# Patient Record
Sex: Male | Born: 1980 | Race: Black or African American | Hispanic: No | Marital: Single | Smoking: Never smoker
Health system: Southern US, Community
[De-identification: ages and names within clinical notes are randomized; demographics above are authoritative.]

## PROBLEM LIST (undated history)

## (undated) DIAGNOSIS — E079 Disorder of thyroid, unspecified: Secondary | ICD-10-CM

## (undated) HISTORY — DX: Disorder of thyroid, unspecified: E07.9

---

## 2011-07-24 ENCOUNTER — Ambulatory Visit: Payer: Self-pay | Admitting: Family Medicine

## 2011-08-04 ENCOUNTER — Emergency Department: Payer: Self-pay | Admitting: Internal Medicine

## 2011-08-04 LAB — COMPREHENSIVE METABOLIC PANEL
Anion Gap: 10 (ref 7–16)
Calcium, Total: 8.6 mg/dL (ref 8.5–10.1)
Chloride: 104 mmol/L (ref 98–107)
Co2: 28 mmol/L (ref 21–32)
Creatinine: 1.28 mg/dL (ref 0.60–1.30)
EGFR (Non-African Amer.): >60
Osmolality: 283 (ref 275–301)
Potassium: 3.5 mmol/L (ref 3.5–5.1)
SGOT(AST): 26 U/L (ref 15–37)
SGPT (ALT): 35 U/L
Sodium: 142 mmol/L (ref 136–145)

## 2011-08-04 LAB — CBC
HCT: 42.3 % (ref 40.0–52.0)
MCV: 85 fL (ref 80–100)
Platelet: 222 10*3/uL (ref 150–440)
WBC: 6.6 10*3/uL (ref 3.8–10.6)

## 2011-08-04 LAB — ETHANOL
Ethanol %: 0.131 % — ABNORMAL HIGH (ref 0.000–0.080)
Ethanol: 131 mg/dL
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
## 2015-10-13 ENCOUNTER — Encounter: Payer: Self-pay | Admitting: Family

## 2015-10-13 ENCOUNTER — Ambulatory Visit (INDEPENDENT_AMBULATORY_CARE_PROVIDER_SITE_OTHER): Payer: BLUE CROSS/BLUE SHIELD | Admitting: Family

## 2015-10-13 DIAGNOSIS — E039 Hypothyroidism, unspecified: Secondary | ICD-10-CM

## 2015-10-13 DIAGNOSIS — A6000 Herpesviral infection of urogenital system, unspecified: Secondary | ICD-10-CM | POA: Diagnosis not present

## 2015-10-13 DIAGNOSIS — Z0000 Encounter for general adult medical examination without abnormal findings: Secondary | ICD-10-CM | POA: Diagnosis not present

## 2015-10-13 NOTE — Progress Notes (Signed)
Subjective:    Patient ID: Logan Hicks, male    DOB: [birth year], 35 y.o.   MRN: [id number]  CC: Logan Hicks is a 35 y.o. male who presents today to establish care.  HPI: Patient here today to establish care as a new patient.  He states he recently saw NP at work, [employer], 3 weeks ago whom did his labs. We've not yet received these records.  He is feeling well and no complaints.Primarily here to establish care as sisters worried about his weight gain. Weighed 180 lbs 5-6 years ago, now weighing 223 lbs.    Hypothyroidism recently changed synthroid from 115 mcg to 125 mcg. No palpitations, excessive sweating, heat/cold intolerance. Diagnosed ~9 years. Has had an thyroid US in past, last 4 years ago.   Genital herpes- had for years, stable.    UTD on vaccines from work per patient.     HISTORY:  Past Medical History:  Diagnosis Date  . Thyroid disease    History reviewed. No pertinent surgical history. Family History  Problem Relation Age of Onset  . Hypertension Mother   . Healthy Father   . Healthy Sister   . Healthy Sister     Allergies: Review of patient's allergies indicates no known allergies. No current outpatient prescriptions on file prior to visit.   No current facility-administered medications on file prior to visit.     Social History  Substance Use Topics  . Smoking status: Never Smoker  . Smokeless tobacco: Never Used  . Alcohol use Yes     Comment: 2 beers, once a week.    Review of Systems  Constitutional: Negative for chills and fever.  HENT: Negative for congestion.   Respiratory: Negative for cough.   Cardiovascular: Negative for chest pain, palpitations and leg swelling.  Gastrointestinal: Negative for diarrhea, nausea and vomiting.  Genitourinary: Negative for frequency, penile pain, penile swelling and testicular pain.  Musculoskeletal: Negative for myalgias.  Skin: Negative for rash.  Neurological: Negative for headaches.    Hematological: Negative for adenopathy.  Psychiatric/Behavioral: Negative for confusion.      Objective:    BP 132/82 (BP Location: Left Arm, Patient Position: Sitting, Cuff Size: Large)   Pulse 80   Temp 98.3 F (36.8 C) (Oral)   Resp 16   Ht 5\' 9"  (1.753 m)   Wt 223 lb (101.2 kg)   SpO2 96%   BMI 32.93 kg/m  BP Readings from Last 3 Encounters:  10/13/15 132/82   Wt Readings from Last 3 Encounters:  10/13/15 223 lb (101.2 kg)    Physical Exam  Constitutional: He appears well-developed and well-nourished.  Neck: No thyroid mass and no thyromegaly present.  Cardiovascular: Regular rhythm and normal heart sounds.   Pulmonary/Chest: Effort normal and breath sounds normal. No respiratory distress. He has no wheezes. He has no rhonchi. He has no rales.  Genitourinary: Testes normal and penis normal. Cremasteric reflex is present. Right testis shows no mass, no swelling and no tenderness. Left testis shows no mass, no swelling and no tenderness. Circumcised.  Genitourinary Comments: Testicular exam performed. No mass or swelling appreciated.   Lymphadenopathy:       Head (right side): No submental, no submandibular, no tonsillar, no preauricular, no posterior auricular and no occipital adenopathy present.       Head (left side): No submental, no submandibular, no tonsillar, no preauricular, no posterior auricular and no occipital adenopathy present.    He has no cervical adenopathy.  He has no axillary adenopathy.  Neurological: He is alert.  Skin: Skin is warm and dry.  Psychiatric: He has a normal mood and affect. His speech is normal and behavior is normal.  Vitals reviewed.      Assessment & Plan:   Problem List Items Addressed This Visit      Endocrine   Hypothyroidism    No masses, asymmetry appreciated and thyroid exam.He also states he had a recent thyroid ultrasound in the past 4 years, and no history of nodules, or biopsy of thyroid. Patient has had recent  blood work including TSH done and his Synthroid dose adjusted. I'm awaiting these results. Patient is asymptomatic. He is frustrated by weight gain. I encouraged  lifestyle modifications including limiting his soda and working out is more than likely to affect his weight.      Relevant Medications   levothyroxine (SYNTHROID, LEVOTHROID) 125 MCG tablet     Genitourinary   Genital herpes    Stable. No acute lesions.      Relevant Medications   acyclovir (ZOVIRAX) 400 MG tablet     Other   Routine physical examination    Patient had recent screening labs done through occupational health at his employment. We are pending his records. He also states he is up-to-date on his immunizations per his employment. Testicular exam done today.       Other Visit Diagnoses   None.      I am having Logan Hicks maintain his acyclovir and levothyroxine.   Meds ordered this encounter  Medications  . acyclovir (ZOVIRAX) 400 MG tablet    Sig: Take 400 mg by mouth 2 (two) times daily.  Marland Kitchen. levothyroxine (SYNTHROID, LEVOTHROID) 125 MCG tablet    Sig: Take 125 mcg by mouth daily before breakfast.    Return precautions given.   Risks, benefits, and alternatives of the medications and treatment plan prescribed today were discussed, and patient expressed understanding.   Education regarding symptom management and diagnosis given to patient on AVS.  Continue to follow with Rennie PlowmanMargaret Aldora Perman, FNP for routine health maintenance.   Molly Maduroravis Kniskern and I agreed with plan.   Rennie PlowmanMargaret Annelle Behrendt, FNP

## 2015-10-13 NOTE — Assessment & Plan Note (Signed)
Patient had recent screening labs done through occupational health at his employment. We are pending his records. He also states he is up-to-date on his immunizations per his employment. Testicular exam done today.

## 2015-10-13 NOTE — Assessment & Plan Note (Signed)
No masses, asymmetry appreciated and thyroid exam.He also states he had a recent thyroid ultrasound in the past 4 years, and no history of nodules, or biopsy of thyroid. Patient has had recent blood work including TSH done and his Synthroid dose adjusted. I'm awaiting these results. Patient is asymptomatic. He is frustrated by weight gain. I encouraged  lifestyle modifications including limiting his soda and working out is more than likely to affect his weight.

## 2015-10-13 NOTE — Patient Instructions (Signed)
My pleasure meeting you.  Health Maintenance, Male A healthy lifestyle and preventative care can promote health and wellness.  Maintain regular health, dental, and eye exams.  Eat a healthy diet. Foods like vegetables, fruits, whole grains, low-fat dairy products, and lean protein foods contain the nutrients you need and are low in calories. Decrease your intake of foods high in solid fats, added sugars, and salt. Get information about a proper diet from your health care provider, if necessary.  Regular physical exercise is one of the most important things you can do for your health. Most adults should get at least 150 minutes of moderate-intensity exercise (any activity that increases your heart rate and causes you to sweat) each week. In addition, most adults need muscle-strengthening exercises on 2 or more days a week.   Maintain a healthy weight. The body mass index (BMI) is a screening tool to identify possible weight problems. It provides an estimate of body fat based on height and weight. Your health care provider can find your BMI and can help you achieve or maintain a healthy weight. For males 20 years and older:  A BMI below 18.5 is considered underweight.  A BMI of 18.5 to 24.9 is normal.  A BMI of 25 to 29.9 is considered overweight.  A BMI of 30 and above is considered obese.  Maintain normal blood lipids and cholesterol by exercising and minimizing your intake of saturated fat. Eat a balanced diet with plenty of fruits and vegetables. Blood tests for lipids and cholesterol should begin at age 34 and be repeated every 5 years. If your lipid or cholesterol levels are high, you are over age 34, or you are at high risk for heart disease, you may need your cholesterol levels checked more frequently.Ongoing high lipid and cholesterol levels should be treated with medicines if diet and exercise are not working.  If you smoke, find out from your health care provider how to quit. If you  do not use tobacco, do not start.  Lung cancer screening is recommended for adults aged 55-80 years who are at high risk for developing lung cancer because of a history of smoking. A yearly low-dose CT scan of the lungs is recommended for people who have at least a 30-pack-year history of smoking and are current smokers or have quit within the past 15 years. A pack year of smoking is smoking an average of 1 pack of cigarettes a day for 1 year (for example, a 30-pack-year history of smoking could mean smoking 1 pack a day for 30 years or 2 packs a day for 15 years). Yearly screening should continue until the smoker has stopped smoking for at least 15 years. Yearly screening should be stopped for people who develop a health problem that would prevent them from having lung cancer treatment.  If you choose to drink alcohol, do not have more than 2 drinks per day. One drink is considered to be 12 oz (360 mL) of beer, 5 oz (150 mL) of wine, or 1.5 oz (45 mL) of liquor.  Avoid the use of street drugs. Do not share needles with anyone. Ask for help if you need support or instructions about stopping the use of drugs.  High blood pressure causes heart disease and increases the risk of stroke. High blood pressure is more likely to develop in:  People who have blood pressure in the end of the normal range (100-139/85-89 mm Hg).  People who are overweight or obese.  People  who are African American.  If you are 918-34 years of age, have your blood pressure checked every 3-5 years. If you are 34 years of age or older, have your blood pressure checked every year. You should have your blood pressure measured twice--once when you are at a hospital or clinic, and once when you are not at a hospital or clinic. Record the average of the two measurements. To check your blood pressure when you are not at a hospital or clinic, you can use:  An automated blood pressure machine at a pharmacy.  A home blood pressure  monitor.  If you are 8045-467 years old, ask your health care provider if you should take aspirin to prevent heart disease.  Diabetes screening involves taking a blood sample to check your fasting blood sugar level. This should be done once every 3 years after age 34 if you are at a normal weight and without risk factors for diabetes. Testing should be considered at a younger age or be carried out more frequently if you are overweight and have at least 1 risk factor for diabetes.  Colorectal cancer can be detected and often prevented. Most routine colorectal cancer screening begins at the age of 34 and continues through age 34. However, your health care provider may recommend screening at an earlier age if you have risk factors for colon cancer. On a yearly basis, your health care provider may provide home test kits to check for hidden blood in the stool. A small camera at the end of a tube may be used to directly examine the colon (sigmoidoscopy or colonoscopy) to detect the earliest forms of colorectal cancer. Talk to your health care provider about this at age 34 when routine screening begins. A direct exam of the colon should be repeated every 5-10 years through age 34, unless early forms of precancerous polyps or small growths are found.  People who are at an increased risk for hepatitis B should be screened for this virus. You are considered at high risk for hepatitis B if:  You were born in a country where hepatitis B occurs often. Talk with your health care provider about which countries are considered high risk.  Your parents were born in a high-risk country and you have not received a shot to protect against hepatitis B (hepatitis B vaccine).  You have HIV or AIDS.  You use needles to inject street drugs.  You live with, or have sex with, someone who has hepatitis B.  You are a man who has sex with other men (MSM).  You get hemodialysis treatment.  You take certain medicines for  conditions like cancer, organ transplantation, and autoimmune conditions.  Hepatitis C blood testing is recommended for all people born from [id number] through 1964 and any individual with known risk factors for hepatitis C.  Healthy men should no longer receive prostate-specific antigen (PSA) blood tests as part of routine cancer screening. Talk to your health care provider about prostate cancer screening.  Testicular cancer screening is not recommended for adolescents or adult males who have no symptoms. Screening includes self-exam, a health care provider exam, and other screening tests. Consult with your health care provider about any symptoms you have or any concerns you have about testicular cancer.  Practice safe sex. Use condoms and avoid high-risk sexual practices to reduce the spread of sexually transmitted infections (STIs).  You should be screened for STIs, including gonorrhea and chlamydia if:  You are sexually active and are younger  than 24 years.  You are older than 24 years, and your health care provider tells you that you are at risk for this type of infection.  Your sexual activity has changed since you were last screened, and you are at an increased risk for chlamydia or gonorrhea. Ask your health care provider if you are at risk.  If you are at risk of being infected with HIV, it is recommended that you take a prescription medicine daily to prevent HIV infection. This is called pre-exposure prophylaxis (PrEP). You are considered at risk if:  You are a man who has sex with other men (MSM).  You are a heterosexual man who is sexually active with multiple partners.  You take drugs by injection.  You are sexually active with a partner who has HIV.  Talk with your health care provider about whether you are at high risk of being infected with HIV. If you choose to begin PrEP, you should first be tested for HIV. You should then be tested every 3 months for as long as you are taking  PrEP.  Use sunscreen. Apply sunscreen liberally and repeatedly throughout the day. You should seek shade when your shadow is shorter than you. Protect yourself by wearing long sleeves, pants, a wide-brimmed hat, and sunglasses year round whenever you are outdoors.  Tell your health care provider of new moles or changes in moles, especially if there is a change in shape or color. Also, tell your health care provider if a mole is larger than the size of a pencil eraser.  A one-time screening for abdominal aortic aneurysm (AAA) and surgical repair of large AAAs by ultrasound is recommended for men aged 65-75 years who are current or former smokers.  Stay current with your vaccines (immunizations).   This information is not intended to replace advice given to you by your health care provider. Make sure you discuss any questions you have with your health care provider.   Document Released: 08/04/2007 Document Revised: 02/26/2014 Document Reviewed: 07/03/2010 Elsevier Interactive Patient Education [employer]2016 [employer]

## 2015-10-13 NOTE — Assessment & Plan Note (Addendum)
Stable. No acute lesions.

## 2015-11-02 ENCOUNTER — Telehealth: Payer: Self-pay

## 2015-11-02 NOTE — Telephone Encounter (Signed)
Left message for patient to return call to tell him that he needs to Follow up with [facility] due to them checking his thyroid.  His thyroid lab came back elevated.  [facility] will need to FU and recheck Thyroid.

## 2015-11-03 LAB — TSH: TSH: 3.55 u[IU]/mL (ref 0.41–5.90)

## 2017-03-08 ENCOUNTER — Ambulatory Visit: Payer: BLUE CROSS/BLUE SHIELD | Attending: Otolaryngology

## 2017-03-08 DIAGNOSIS — G4733 Obstructive sleep apnea (adult) (pediatric): Secondary | ICD-10-CM | POA: Insufficient documentation

## 2019-02-19 ENCOUNTER — Other Ambulatory Visit: Payer: Self-pay

## 2019-02-19 ENCOUNTER — Ambulatory Visit: Payer: BC Managed Care – PPO | Admitting: Urology

## 2019-02-19 ENCOUNTER — Encounter: Payer: Self-pay | Admitting: Urology

## 2019-02-19 VITALS — BP 153/81 | HR 99 | Ht 69.0 in | Wt 240.0 lb

## 2019-02-19 DIAGNOSIS — E349 Endocrine disorder, unspecified: Secondary | ICD-10-CM | POA: Diagnosis not present

## 2019-02-19 NOTE — Progress Notes (Signed)
02/19/2019 1:41 PM   Logan Hicks [birth year] [id number]  Referring provider: Burnard Hawthorne, FNP [address] [place],  [place] [address]  No chief complaint on file.   HPI: Logan Hicks is a 38 year old male who is referred by Serita Butcher, FNP for testosterone deficiency.   Testosterone deficiency Patient is experiencing a decrease in libido, a lack of energy, a decrease in strength, sadness and/or grumpiness, erections being less strong and falling asleep after dinner.  This is indicated by his responses to the ADAM questionnaire.  He is still having having spontaneous erections at night.   He has sleep apnea and is not sleeping with a CPAP machine.   CPAP was cost prohibitive.  He is reporting a loss of body hair, reduced beard growth, obesity and he does not have any biological children.  He does not desire to have children.  He has been treated for low testostesterone deficiency about 10 years ago at Dr. Hetty Blend office.  He was on the gel, but he did not like the gels.   His pretreatment testosterone level was 191 ng/dL on 12/31/2018.  This was a morning specimen.   His FSH, LH and prolactin are pending.       IPSS score: 2/0      Major urinary complaint(s): None.  Denies any dysuria, hematuria or suprapubic pain.   Denies any recent fevers, chills, nausea or vomiting.  He does not have a family history of PCa. IPSS    Row Name 03/05/19 1200         International Prostate Symptom Score   How often have you had the sensation of not emptying your bladder?  Not at All     How often have you had to urinate less than every two hours?  Less than 1 in 5 times     How often have you found you stopped and started again several times when you urinated?  Not at All     How often have you found it difficult to postpone urination?  Not at All     How often have you had a weak urinary stream?  Not at All     How often have you had to strain to start urination?  Less  than 1 in 5 times     How many times did you typically get up at night to urinate?  None     Total IPSS Score  2       Quality of Life due to urinary symptoms   If you were to spend the rest of your life with your urinary condition just the way it is now how would you feel about that?  Delighted        Score:  1-7 Mild 8-19 Moderate 20-35 Severe  Erectile dysfunction SHIM score: 21    Risk factors:  Age and hypothyroidism.    No painful erections or curvatures with his erections.    Still having spontaneous erections SHIM    Row Name 03/05/19 1202         SHIM: Over the last 6 months:   How do you rate your confidence that you could get and keep an erection?  Moderate     When you had erections with sexual stimulation, how often were your erections hard enough for penetration (entering your partner)?  Almost Always or Always     During sexual intercourse, how often were you able to maintain your  erection after you had penetrated (entered) your partner?  Almost Always or Always     During sexual intercourse, how difficult was it to maintain your erection to completion of intercourse?  Not Difficult     When you attempted sexual intercourse, how often was it satisfactory for you?  Sometimes (about half the time)       SHIM Total Score   SHIM  21        Score: 1-7 Severe ED 8-11 Moderate ED 12-16 Mild-Moderate ED 17-21 Mild ED 22-25 No ED      PMH: Past Medical History:  Diagnosis Date  . Thyroid disease     Surgical History: No past surgical history on file.  Home Medications:  Allergies as of 02/19/2019   No Known Allergies     Medication List       Accurate as of February 19, 2019 11:59 PM. If you have any questions, ask your nurse or doctor.        acyclovir 400 MG tablet Commonly known as: ZOVIRAX Take 400 mg by mouth 2 (two) times daily.   levothyroxine 125 MCG tablet Commonly known as: SYNTHROID Take 125 mcg by mouth daily before  breakfast.       Allergies: No Known Allergies  Family History: Family History  Problem Relation Age of Onset  . Hypertension Mother   . Healthy Father   . Healthy Sister   . Healthy Sister     Social History:  reports that he has never smoked. He has never used smokeless tobacco. He reports current alcohol use. He reports that he does not use drugs.  ROS: UROLOGY Frequent Urination?: No Hard to postpone urination?: No Burning/pain with urination?: No Get up at night to urinate?: No Leakage of urine?: No Urine stream starts and stops?: No Trouble starting stream?: No Do you have to strain to urinate?: No Blood in urine?: No Urinary tract infection?: No Sexually transmitted disease?: No Injury to kidneys or bladder?: No Painful intercourse?: No Weak stream?: No Erection problems?: No Penile pain?: No  Gastrointestinal Nausea?: No Vomiting?: No Indigestion/heartburn?: No Diarrhea?: No Constipation?: No  Constitutional Fever: No Night sweats?: No Weight loss?: No Fatigue?: Yes  Skin Skin rash/lesions?: No Itching?: No  Eyes Blurred vision?: No Double vision?: No  Ears/Nose/Throat Sore throat?: No Sinus problems?: No  Hematologic/Lymphatic Swollen glands?: No Easy bruising?: No  Cardiovascular Leg swelling?: No Chest pain?: No  Respiratory Cough?: No Shortness of breath?: No  Endocrine Excessive thirst?: No  Musculoskeletal Back pain?: No Joint pain?: No  Neurological Headaches?: No Dizziness?: No  Psychologic Depression?: No Anxiety?: No  Physical Exam: BP (!) 153/81   Pulse 99   Ht 5\' 9"  (1.753 m)   Wt 240 lb (108.9 kg)   BMI 35.44 kg/m   Constitutional:  Well nourished. Alert and oriented, No acute distress. HEENT: Painter AT, moist mucus membranes.  Trachea midline, no masses. Cardiovascular: No clubbing, cyanosis, or edema. Respiratory: Normal respiratory effort, no increased work of breathing. GI: Abdomen is soft, non  tender, non distended, no abdominal masses. Liver and spleen not palpable.  No hernias appreciated.  Stool sample for occult testing is not indicated.   GU: No CVA tenderness.  No bladder fullness or masses.  Patient with circumcised phallus. Urethral meatus is patent.  No penile discharge. No penile lesions or rashes. Scrotum without lesions, cysts, rashes and/or edema.  Testicles are located scrotally bilaterally. No masses are appreciated in the testicles. Left and right  epididymis are normal. Rectal: Not performed.   Skin: No rashes, bruises or suspicious lesions. Lymph: No inguinal adenopathy. Neurologic: Grossly intact, no focal deficits, moving all 4 extremities. Psychiatric: Normal mood and affect.  Laboratory Data: Lab Results  Component Value Date   WBC 6.6 08/04/2011   HGB 14.3 08/04/2011   HCT 42.3 08/04/2011   MCV 85 08/04/2011   PLT 222 08/04/2011    Lab Results  Component Value Date   CREATININE 1.28 08/04/2011    No results found for: PSA  Lab Results  Component Value Date   TESTOSTERONE 329 02/26/2019    No results found for: HGBA1C  Lab Results  Component Value Date   TSH 3.55 11/03/2015    No results found for: CHOL, HDL, CHOLHDL, VLDL, LDLCALC  Lab Results  Component Value Date   AST 19 02/26/2019   Lab Results  Component Value Date   ALT 20 02/26/2019   No components found for: ALKALINEPHOPHATASE No components found for: BILIRUBINTOTAL  No results found for: ESTRADIOL  Urinalysis No results found for: COLORURINE, APPEARANCEUR, LABSPEC, PHURINE, GLUCOSEU, HGBUR, BILIRUBINUR, KETONESUR, PROTEINUR, UROBILINOGEN, NITRITE, LEUKOCYTESUR  I have reviewed the labs.  Assessment & Plan:    1. Testosterone deficiency I explained to patient that the current guidelines from the AUA reports the diagnosis of hypogonadism requires a morning serum total testosterone level at least 2 days apart that is below 300 ng/dL - most insurances require the blood  work to be done before 9 am At this time, the patient does not meet this requirement.  He will return for his sencond morning serum testosterones before 10 AM  If the testosterones levels return below 300 ng/dL we will need to do additional blood work [facility](LH - if that returns below normal or low normal we will need to add a prolactin) - if that blood work is abnormal - we will need to refer to endocrinology I discussed with the patient the side effects of testosterone therapy, such as: enlargement of the prostate gland that may in turn cause LUTS, possible increased risk of PCa, DVT's and/or PE's, possible increased risk of heart attack or stroke, lower sperm count, swelling of the ankles, feet, or body, with or without heart failure, enlarged or painful breasts, have problems breathing while you sleep (sleep apnea), increased prostate specific antigen, mood swings, hypertension and increased red blood cell count - we will need to check HCT/HBG levels prior to therapy and PSA if the patient is over 40 I also discussed that some men have had success using clomid for hypogonadism.  It does seem to be more successful in younger men, but there are incidences of good results in middle-aged men.  I explained that it is used in male infertility to stimulate the testicles to make more testosterone/sperm.  There has been no long term data on side effects, but some urologists has been having success with this medication - he is mostly interested in this mode of therapy  Return for testosterone, LH, FSH, prolactin and LFT's before 10 am .  These notes generated with voice recognition software. I apologize for typographical errors.  Michiel CowboySHANNON Abdulahad Mederos, PA-C  [facility] [address]  [address] [place], [place] [address] [phone]

## 2019-02-25 ENCOUNTER — Other Ambulatory Visit: Payer: Self-pay

## 2019-02-25 DIAGNOSIS — E291 Testicular hypofunction: Secondary | ICD-10-CM

## 2019-02-26 ENCOUNTER — Other Ambulatory Visit: Payer: BC Managed Care – PPO

## 2019-02-26 ENCOUNTER — Other Ambulatory Visit: Payer: Self-pay

## 2019-02-26 DIAGNOSIS — E291 Testicular hypofunction: Secondary | ICD-10-CM

## 2019-02-27 LAB — HEPATIC FUNCTION PANEL
ALT: 20 IU/L (ref 0–44)
AST: 19 IU/L (ref 0–40)
Albumin: 4.6 g/dL (ref 4.0–5.0)
Alkaline Phosphatase: 87 IU/L (ref 39–117)
Bilirubin Total: 0.3 mg/dL (ref 0.0–1.2)
Bilirubin, Direct: 0.09 mg/dL (ref 0.00–0.40)
Total Protein: 7.6 g/dL (ref 6.0–8.5)

## 2019-02-27 LAB — FSH/LH
FSH: 2.6 m[IU]/mL (ref 1.5–12.4)
LH: 6.1 m[IU]/mL (ref 1.7–8.6)

## 2019-02-27 LAB — TESTOSTERONE: Testosterone: 329 ng/dL (ref 264–916)

## 2019-02-27 LAB — PROLACTIN: Prolactin: 10.1 ng/mL (ref 4.0–15.2)

## 2019-05-15 ENCOUNTER — Ambulatory Visit: Payer: BC Managed Care – PPO | Attending: Internal Medicine

## 2019-05-15 DIAGNOSIS — Z23 Encounter for immunization: Secondary | ICD-10-CM

## 2019-05-15 NOTE — Progress Notes (Signed)
   Covid-19 Vaccination Clinic  Name:  Brit Wernette    MRN: [id number] DOB: 1980-12-19  05/15/2019  Mr. Finelli was observed post Covid-19 immunization for 15 minutes without incident. He was provided with Vaccine Information Sheet and instruction to access the V-Safe system.   Mr. Bonnin was instructed to call 911 with any severe reactions post vaccine: Marland Kitchen Difficulty breathing  . Swelling of face and throat  . A fast heartbeat  . A bad rash all over body  . Dizziness and weakness   Immunizations Administered    Name Date Dose VIS Date Route   Pfizer COVID-19 Vaccine 05/15/2019 12:45 PM 0.3 mL 01/30/2019 Intramuscular   Manufacturer: [employer], [employer]   Lot: [id number]   NDC: [address]-4

## 2019-06-09 ENCOUNTER — Ambulatory Visit: Payer: BC Managed Care – PPO | Attending: Internal Medicine

## 2019-06-09 DIAGNOSIS — Z23 Encounter for immunization: Secondary | ICD-10-CM

## 2019-06-09 NOTE — Progress Notes (Signed)
   Covid-19 Vaccination Clinic  Name:  Kamar Callender    MRN: [id number] DOB: 10/31/1980  06/09/2019  Mr. Natter was observed post Covid-19 immunization for 15 minutes without incident. He was provided with Vaccine Information Sheet and instruction to access the V-Safe system.   Mr. Mangas was instructed to call 911 with any severe reactions post vaccine: Marland Kitchen Difficulty breathing  . Swelling of face and throat  . A fast heartbeat  . A bad rash all over body  . Dizziness and weakness   Immunizations Administered    Name Date Dose VIS Date Route   Pfizer COVID-19 Vaccine 06/09/2019 10:27 AM 0.3 mL 04/15/2018 Intramuscular   Manufacturer: [employer], [employer]   Lot: [id number]   NDC: [address]-0
